# Patient Record
Sex: Male | Born: 1995 | Race: Black or African American | Hispanic: No | Marital: Single
Health system: Southern US, Community
[De-identification: ages and names within clinical notes are randomized; demographics above are authoritative.]

## PROBLEM LIST (undated history)

## (undated) DIAGNOSIS — G8254 Quadriplegia, C5-C7 incomplete: Secondary | ICD-10-CM

## (undated) DIAGNOSIS — W3400XA Accidental discharge from unspecified firearms or gun, initial encounter: Secondary | ICD-10-CM

## (undated) DIAGNOSIS — J9621 Acute and chronic respiratory failure with hypoxia: Secondary | ICD-10-CM

## (undated) DIAGNOSIS — Z930 Tracheostomy status: Secondary | ICD-10-CM

---

## 2019-01-27 ENCOUNTER — Inpatient Hospital Stay
Admission: RE | Admit: 2019-01-27 | Discharge: 2019-02-18 | Disposition: A | Discharge status: Rehabilitation Facility | Payer: Medicare Other | Source: Ambulatory Visit | Attending: Internal Medicine | Admitting: Internal Medicine

## 2019-01-27 ENCOUNTER — Other Ambulatory Visit (HOSPITAL_COMMUNITY): Payer: Medicare Other

## 2019-01-27 DIAGNOSIS — Z930 Tracheostomy status: Secondary | ICD-10-CM

## 2019-01-27 DIAGNOSIS — W3400XA Accidental discharge from unspecified firearms or gun, initial encounter: Secondary | ICD-10-CM

## 2019-01-27 DIAGNOSIS — R509 Fever, unspecified: Secondary | ICD-10-CM

## 2019-01-27 DIAGNOSIS — Z931 Gastrostomy status: Secondary | ICD-10-CM

## 2019-01-27 DIAGNOSIS — J9621 Acute and chronic respiratory failure with hypoxia: Secondary | ICD-10-CM | POA: Diagnosis present

## 2019-01-27 DIAGNOSIS — G8254 Quadriplegia, C5-C7 incomplete: Secondary | ICD-10-CM | POA: Diagnosis present

## 2019-01-27 DIAGNOSIS — J189 Pneumonia, unspecified organism: Secondary | ICD-10-CM

## 2019-01-27 HISTORY — DX: Quadriplegia, C5-C7 incomplete: G82.54

## 2019-01-27 HISTORY — DX: Acute and chronic respiratory failure with hypoxia: J96.21

## 2019-01-27 HISTORY — DX: Accidental discharge from unspecified firearms or gun, initial encounter: W34.00XA

## 2019-01-27 HISTORY — DX: Tracheostomy status: Z93.0

## 2019-01-27 MED ORDER — IOHEXOL 300 MG/ML  SOLN: 40.0000 mL | Freq: Once | INTRAMUSCULAR | Status: DC | PRN

## 2019-01-28 ENCOUNTER — Encounter: Payer: Self-pay | Admitting: Internal Medicine

## 2019-01-28 ENCOUNTER — Other Ambulatory Visit (HOSPITAL_COMMUNITY): Payer: Medicare Other

## 2019-01-28 DIAGNOSIS — Z930 Tracheostomy status: Secondary | ICD-10-CM

## 2019-01-28 DIAGNOSIS — W3400XA Accidental discharge from unspecified firearms or gun, initial encounter: Secondary | ICD-10-CM | POA: Diagnosis not present

## 2019-01-28 DIAGNOSIS — J9621 Acute and chronic respiratory failure with hypoxia: Secondary | ICD-10-CM | POA: Diagnosis not present

## 2019-01-28 DIAGNOSIS — G8254 Quadriplegia, C5-C7 incomplete: Secondary | ICD-10-CM | POA: Diagnosis not present

## 2019-01-28 LAB — COMPREHENSIVE METABOLIC PANEL
ALT: 123 U/L — ABNORMAL HIGH (ref 0–44)
AST: 72 U/L — ABNORMAL HIGH (ref 15–41)
Albumin: 2.6 g/dL — ABNORMAL LOW (ref 3.5–5.0)
Alkaline Phosphatase: 77 U/L (ref 38–126)
Anion gap: 9 (ref 5–15)
BUN: 15 mg/dL (ref 6–20)
CO2: 26 mmol/L (ref 22–32)
Calcium: 9.3 mg/dL (ref 8.9–10.3)
Chloride: 99 mmol/L (ref 98–111)
Creatinine, Ser: 0.73 mg/dL (ref 0.61–1.24)
GFR calc Af Amer: >60 mL/min (ref >60)
GFR calc non Af Amer: >60 mL/min (ref >60)
Glucose, Bld: 102 mg/dL — ABNORMAL HIGH (ref 70–99)
Potassium: 4.5 mmol/L (ref 3.5–5.1)
Sodium: 134 mmol/L — ABNORMAL LOW (ref 135–145)
Total Bilirubin: 0.6 mg/dL (ref 0.3–1.2)
Total Protein: 6.6 g/dL (ref 6.5–8.1)

## 2019-01-28 LAB — CBC
HCT: 30.4 % — ABNORMAL LOW (ref 39.0–52.0)
Hemoglobin: 9.6 g/dL — ABNORMAL LOW (ref 13.0–17.0)
MCH: 27 pg (ref 26.0–34.0)
MCHC: 31.6 g/dL (ref 30.0–36.0)
MCV: 85.4 fL (ref 80.0–100.0)
Platelets: 441 10*3/uL — ABNORMAL HIGH (ref 150–400)
RBC: 3.56 MIL/uL — ABNORMAL LOW (ref 4.22–5.81)
RDW: 13.8 % (ref 11.5–15.5)
WBC: 8.3 10*3/uL (ref 4.0–10.5)
nRBC: 0 % (ref 0.0–0.2)

## 2019-01-28 LAB — MAGNESIUM: Magnesium: 2 mg/dL (ref 1.7–2.4)

## 2019-01-28 MED ORDER — CVS NUTRITION LIQUID PO LIQD
1.00 | ORAL | Status: DC
Start: 2019-01-27 — End: 2019-01-28

## 2019-01-28 MED ORDER — NEXTERONE IV
81.00 | INTRAVENOUS | Status: DC
Start: 2019-01-28 — End: 2019-01-28

## 2019-01-28 MED ORDER — QUINERVA 260 MG PO TABS
325.00 | ORAL_TABLET | ORAL | Status: DC
Start: 2019-01-27 — End: 2019-01-28

## 2019-01-28 MED ORDER — TRIAMINIC COUGH/SORE THROAT PO
15.00 | ORAL | Status: DC
Start: 2019-01-27 — End: 2019-01-28

## 2019-01-28 MED ORDER — METHOCARBAMOL 500 MG PO TABS
500.00 | ORAL_TABLET | ORAL | Status: DC
Start: ? — End: 2019-01-28

## 2019-01-28 MED ORDER — METHYLPHENIDATE HCL POWD
50.00 | Status: DC
Start: ? — End: 2019-01-28

## 2019-01-28 MED ORDER — TUMS LASTING EFFECTS PO
30.00 | ORAL | Status: DC
Start: 2019-01-27 — End: 2019-01-28

## 2019-01-28 MED ORDER — OXYCODONE-ACETAMINOPHEN 5-325 MG PO TABS
1.00 | ORAL_TABLET | ORAL | Status: DC
Start: ? — End: 2019-01-28

## 2019-01-28 MED ORDER — Medication
30.00 | Status: DC
Start: 2019-01-27 — End: 2019-01-28

## 2019-01-28 MED ORDER — TRIAMINIC COUGH/SORE THROAT PO
15.00 | ORAL | Status: DC
Start: ? — End: 2019-01-28

## 2019-01-28 MED ORDER — Medication
Status: DC
Start: 2019-01-27 — End: 2019-01-28

## 2019-01-28 MED ORDER — GENERIC EXTERNAL MEDICATION
Status: DC
Start: ? — End: 2019-01-28

## 2019-01-28 MED ORDER — PCCA BIOPEPTIDE BASE EX CREA
600.00 | TOPICAL_CREAM | CUTANEOUS | Status: DC
Start: 2019-01-27 — End: 2019-01-28

## 2019-01-28 MED ORDER — DICLOXACILLIN SODIUM 62.5 MG/5ML PO SUSR
10.00 | ORAL | Status: DC
Start: 2019-01-27 — End: 2019-01-28

## 2019-01-28 MED ORDER — CHICKEN FLAVOR WATER MISCIBLE LIQD
100.00 | Status: DC
Start: 2019-01-27 — End: 2019-01-28

## 2019-01-28 MED ORDER — WATER BOTTLE ECONOMY #15 MISC
20.00 | Status: DC
Start: 2019-01-27 — End: 2019-01-28

## 2019-01-28 MED ORDER — MP TRI-FED COLD 2.5-60 MG PO TABS
25.00 | ORAL_TABLET | ORAL | Status: DC
Start: ? — End: 2019-01-28

## 2019-01-28 MED ORDER — ORALSEPTIC 1.4 % MT LIQD
1.00 | OROMUCOSAL | Status: DC
Start: 2019-01-27 — End: 2019-01-28

## 2019-01-28 MED ORDER — CITALOPRAM HYDROBROMIDE 10 MG/5ML PO SOLN
20.00 | ORAL | Status: DC
Start: 2019-01-28 — End: 2019-01-28

## 2019-01-28 MED ORDER — ACACIA POWD
3.00 | Status: DC
Start: 2019-01-27 — End: 2019-01-28

## 2019-01-28 MED ORDER — VICON FORTE PO CAPS
17.00 | ORAL_CAPSULE | ORAL | Status: DC
Start: 2019-01-28 — End: 2019-01-28

## 2019-01-28 MED ORDER — CALCIUM-MAGNESIUM-VITAMIN D PO
0.50 | ORAL | Status: DC
Start: ? — End: 2019-01-28

## 2019-01-28 MED ORDER — MILRINONE LACTATE 20 MG/20ML IV SOLN
0.40 | INTRAVENOUS | Status: DC
Start: ? — End: 2019-01-28

## 2019-01-28 MED ORDER — ACCU-PRO PUMP SET/VENT MISC
2.50 | Status: DC
Start: ? — End: 2019-01-28
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
## 2019-01-29 DIAGNOSIS — G8254 Quadriplegia, C5-C7 incomplete: Secondary | ICD-10-CM | POA: Diagnosis not present

## 2019-01-29 DIAGNOSIS — J9621 Acute and chronic respiratory failure with hypoxia: Secondary | ICD-10-CM | POA: Diagnosis not present

## 2019-01-29 DIAGNOSIS — W3400XA Accidental discharge from unspecified firearms or gun, initial encounter: Secondary | ICD-10-CM | POA: Diagnosis not present

## 2019-01-29 DIAGNOSIS — Z930 Tracheostomy status: Secondary | ICD-10-CM | POA: Diagnosis not present

## 2019-01-29 LAB — URINALYSIS, ROUTINE W REFLEX MICROSCOPIC
Bilirubin Urine: NEGATIVE
Glucose, UA: NEGATIVE mg/dL
Hgb urine dipstick: NEGATIVE
Ketones, ur: NEGATIVE mg/dL
Leukocytes,Ua: NEGATIVE
Nitrite: NEGATIVE
Protein, ur: NEGATIVE mg/dL
Specific Gravity, Urine: 1.025 (ref 1.005–1.030)
pH: 7 (ref 5.0–8.0)
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
## 2019-01-30 DIAGNOSIS — J9621 Acute and chronic respiratory failure with hypoxia: Secondary | ICD-10-CM | POA: Diagnosis not present

## 2019-01-30 DIAGNOSIS — Z930 Tracheostomy status: Secondary | ICD-10-CM | POA: Diagnosis not present

## 2019-01-30 DIAGNOSIS — G8254 Quadriplegia, C5-C7 incomplete: Secondary | ICD-10-CM | POA: Diagnosis not present

## 2019-01-30 DIAGNOSIS — W3400XA Accidental discharge from unspecified firearms or gun, initial encounter: Secondary | ICD-10-CM | POA: Diagnosis not present

## 2019-01-30 LAB — URINE CULTURE
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
## 2019-02-01 DIAGNOSIS — J9621 Acute and chronic respiratory failure with hypoxia: Secondary | ICD-10-CM | POA: Diagnosis not present

## 2019-02-01 DIAGNOSIS — Z930 Tracheostomy status: Secondary | ICD-10-CM | POA: Diagnosis not present

## 2019-02-01 DIAGNOSIS — G8254 Quadriplegia, C5-C7 incomplete: Secondary | ICD-10-CM | POA: Diagnosis not present

## 2019-02-01 DIAGNOSIS — W3400XA Accidental discharge from unspecified firearms or gun, initial encounter: Secondary | ICD-10-CM | POA: Diagnosis not present
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
## 2019-02-02 DIAGNOSIS — Z930 Tracheostomy status: Secondary | ICD-10-CM | POA: Diagnosis not present

## 2019-02-02 DIAGNOSIS — W3400XA Accidental discharge from unspecified firearms or gun, initial encounter: Secondary | ICD-10-CM | POA: Diagnosis not present

## 2019-02-02 DIAGNOSIS — G8254 Quadriplegia, C5-C7 incomplete: Secondary | ICD-10-CM | POA: Diagnosis not present

## 2019-02-02 DIAGNOSIS — J9621 Acute and chronic respiratory failure with hypoxia: Secondary | ICD-10-CM | POA: Diagnosis not present

## 2019-02-02 LAB — BASIC METABOLIC PANEL
Anion gap: 11 (ref 5–15)
BUN: 25 mg/dL — ABNORMAL HIGH (ref 6–20)
CO2: 25 mmol/L (ref 22–32)
Calcium: 9.4 mg/dL (ref 8.9–10.3)
Chloride: 105 mmol/L (ref 98–111)
Creatinine, Ser: 0.71 mg/dL (ref 0.61–1.24)
GFR calc Af Amer: >60 mL/min (ref >60)
GFR calc non Af Amer: >60 mL/min (ref >60)
Glucose, Bld: 123 mg/dL — ABNORMAL HIGH (ref 70–99)
Potassium: 4.3 mmol/L (ref 3.5–5.1)
Sodium: 141 mmol/L (ref 135–145)

## 2019-02-02 LAB — CBC
HCT: 30.1 % — ABNORMAL LOW (ref 39.0–52.0)
Hemoglobin: 9.4 g/dL — ABNORMAL LOW (ref 13.0–17.0)
MCH: 27.3 pg (ref 26.0–34.0)
MCHC: 31.2 g/dL (ref 30.0–36.0)
MCV: 87.5 fL (ref 80.0–100.0)
Platelets: 495 10*3/uL — ABNORMAL HIGH (ref 150–400)
RBC: 3.44 MIL/uL — ABNORMAL LOW (ref 4.22–5.81)
RDW: 14.4 % (ref 11.5–15.5)
WBC: 8.3 10*3/uL (ref 4.0–10.5)
nRBC: 0 % (ref 0.0–0.2)
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
## 2019-02-03 ENCOUNTER — Other Ambulatory Visit (HOSPITAL_COMMUNITY): Payer: Medicare Other

## 2019-02-03 DIAGNOSIS — W3400XA Accidental discharge from unspecified firearms or gun, initial encounter: Secondary | ICD-10-CM | POA: Diagnosis not present

## 2019-02-03 DIAGNOSIS — G8254 Quadriplegia, C5-C7 incomplete: Secondary | ICD-10-CM | POA: Diagnosis not present

## 2019-02-03 DIAGNOSIS — Z930 Tracheostomy status: Secondary | ICD-10-CM | POA: Diagnosis not present

## 2019-02-03 DIAGNOSIS — J9621 Acute and chronic respiratory failure with hypoxia: Secondary | ICD-10-CM | POA: Diagnosis not present

## 2019-02-03 LAB — CULTURE, BLOOD (ROUTINE X 2)
Culture: NO GROWTH
Culture: NO GROWTH
Special Requests: ADEQUATE
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
## 2019-02-04 DIAGNOSIS — J9621 Acute and chronic respiratory failure with hypoxia: Secondary | ICD-10-CM | POA: Diagnosis not present

## 2019-02-04 DIAGNOSIS — Z930 Tracheostomy status: Secondary | ICD-10-CM | POA: Diagnosis not present

## 2019-02-04 DIAGNOSIS — G8254 Quadriplegia, C5-C7 incomplete: Secondary | ICD-10-CM | POA: Diagnosis not present

## 2019-02-04 LAB — URINE CULTURE: Culture: NO GROWTH
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
## 2019-02-05 DIAGNOSIS — G8254 Quadriplegia, C5-C7 incomplete: Secondary | ICD-10-CM | POA: Diagnosis not present

## 2019-02-05 DIAGNOSIS — J9621 Acute and chronic respiratory failure with hypoxia: Secondary | ICD-10-CM | POA: Diagnosis not present

## 2019-02-05 DIAGNOSIS — Z930 Tracheostomy status: Secondary | ICD-10-CM | POA: Diagnosis not present
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
## 2019-02-06 DIAGNOSIS — Z930 Tracheostomy status: Secondary | ICD-10-CM | POA: Diagnosis not present

## 2019-02-06 DIAGNOSIS — J9621 Acute and chronic respiratory failure with hypoxia: Secondary | ICD-10-CM | POA: Diagnosis not present

## 2019-02-06 DIAGNOSIS — G8254 Quadriplegia, C5-C7 incomplete: Secondary | ICD-10-CM | POA: Diagnosis not present

## 2019-02-06 LAB — CULTURE, RESPIRATORY W GRAM STAIN
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
## 2019-02-07 DIAGNOSIS — Z930 Tracheostomy status: Secondary | ICD-10-CM | POA: Diagnosis not present

## 2019-02-07 DIAGNOSIS — J9621 Acute and chronic respiratory failure with hypoxia: Secondary | ICD-10-CM | POA: Diagnosis not present

## 2019-02-07 DIAGNOSIS — G8254 Quadriplegia, C5-C7 incomplete: Secondary | ICD-10-CM | POA: Diagnosis not present
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
## 2019-02-08 DIAGNOSIS — Z930 Tracheostomy status: Secondary | ICD-10-CM | POA: Diagnosis not present

## 2019-02-08 DIAGNOSIS — G8254 Quadriplegia, C5-C7 incomplete: Secondary | ICD-10-CM | POA: Diagnosis not present

## 2019-02-08 DIAGNOSIS — J9621 Acute and chronic respiratory failure with hypoxia: Secondary | ICD-10-CM | POA: Diagnosis not present

## 2019-02-08 LAB — CBC
HCT: 29.2 % — ABNORMAL LOW (ref 39.0–52.0)
Hemoglobin: 8.9 g/dL — ABNORMAL LOW (ref 13.0–17.0)
MCH: 27.1 pg (ref 26.0–34.0)
MCHC: 30.5 g/dL (ref 30.0–36.0)
MCV: 88.8 fL (ref 80.0–100.0)
Platelets: 489 10*3/uL — ABNORMAL HIGH (ref 150–400)
RBC: 3.29 MIL/uL — ABNORMAL LOW (ref 4.22–5.81)
RDW: 14.4 % (ref 11.5–15.5)
WBC: 5.2 10*3/uL (ref 4.0–10.5)
nRBC: 0 % (ref 0.0–0.2)

## 2019-02-08 LAB — BASIC METABOLIC PANEL
Anion gap: 11 (ref 5–15)
BUN: 23 mg/dL — ABNORMAL HIGH (ref 6–20)
CO2: 25 mmol/L (ref 22–32)
Calcium: 9.5 mg/dL (ref 8.9–10.3)
Chloride: 104 mmol/L (ref 98–111)
Creatinine, Ser: 0.77 mg/dL (ref 0.61–1.24)
GFR calc Af Amer: >60 mL/min (ref >60)
GFR calc non Af Amer: >60 mL/min (ref >60)
Glucose, Bld: 95 mg/dL (ref 70–99)
Potassium: 4.2 mmol/L (ref 3.5–5.1)
Sodium: 140 mmol/L (ref 135–145)
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
## 2019-02-09 DIAGNOSIS — G8254 Quadriplegia, C5-C7 incomplete: Secondary | ICD-10-CM | POA: Diagnosis not present

## 2019-02-09 DIAGNOSIS — Z930 Tracheostomy status: Secondary | ICD-10-CM | POA: Diagnosis not present

## 2019-02-09 DIAGNOSIS — J9621 Acute and chronic respiratory failure with hypoxia: Secondary | ICD-10-CM | POA: Diagnosis not present
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
## 2019-02-10 DIAGNOSIS — G8254 Quadriplegia, C5-C7 incomplete: Secondary | ICD-10-CM | POA: Diagnosis not present

## 2019-02-10 DIAGNOSIS — W3400XA Accidental discharge from unspecified firearms or gun, initial encounter: Secondary | ICD-10-CM | POA: Diagnosis not present

## 2019-02-10 DIAGNOSIS — J9621 Acute and chronic respiratory failure with hypoxia: Secondary | ICD-10-CM | POA: Diagnosis not present

## 2019-02-10 DIAGNOSIS — Z930 Tracheostomy status: Secondary | ICD-10-CM | POA: Diagnosis not present
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
## 2019-02-11 DIAGNOSIS — Z930 Tracheostomy status: Secondary | ICD-10-CM | POA: Diagnosis not present

## 2019-02-11 DIAGNOSIS — W3400XA Accidental discharge from unspecified firearms or gun, initial encounter: Secondary | ICD-10-CM | POA: Diagnosis not present

## 2019-02-11 DIAGNOSIS — G8254 Quadriplegia, C5-C7 incomplete: Secondary | ICD-10-CM | POA: Diagnosis not present

## 2019-02-11 DIAGNOSIS — J9621 Acute and chronic respiratory failure with hypoxia: Secondary | ICD-10-CM | POA: Diagnosis not present
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
## 2019-02-12 ENCOUNTER — Other Ambulatory Visit (HOSPITAL_COMMUNITY): Payer: Medicare Other

## 2019-02-12 DIAGNOSIS — W3400XA Accidental discharge from unspecified firearms or gun, initial encounter: Secondary | ICD-10-CM | POA: Diagnosis not present

## 2019-02-12 DIAGNOSIS — Z930 Tracheostomy status: Secondary | ICD-10-CM | POA: Diagnosis not present

## 2019-02-12 DIAGNOSIS — J9621 Acute and chronic respiratory failure with hypoxia: Secondary | ICD-10-CM | POA: Diagnosis not present

## 2019-02-12 DIAGNOSIS — G8254 Quadriplegia, C5-C7 incomplete: Secondary | ICD-10-CM | POA: Diagnosis not present
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
## 2019-02-13 DIAGNOSIS — J9621 Acute and chronic respiratory failure with hypoxia: Secondary | ICD-10-CM | POA: Diagnosis not present

## 2019-02-13 DIAGNOSIS — Z930 Tracheostomy status: Secondary | ICD-10-CM | POA: Diagnosis not present

## 2019-02-13 DIAGNOSIS — W3400XA Accidental discharge from unspecified firearms or gun, initial encounter: Secondary | ICD-10-CM | POA: Diagnosis not present

## 2019-02-13 DIAGNOSIS — G8254 Quadriplegia, C5-C7 incomplete: Secondary | ICD-10-CM | POA: Diagnosis not present
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
## 2019-02-14 DIAGNOSIS — G8254 Quadriplegia, C5-C7 incomplete: Secondary | ICD-10-CM | POA: Diagnosis not present

## 2019-02-14 DIAGNOSIS — W3400XA Accidental discharge from unspecified firearms or gun, initial encounter: Secondary | ICD-10-CM | POA: Diagnosis not present

## 2019-02-14 DIAGNOSIS — J9621 Acute and chronic respiratory failure with hypoxia: Secondary | ICD-10-CM | POA: Diagnosis not present

## 2019-02-14 DIAGNOSIS — Z930 Tracheostomy status: Secondary | ICD-10-CM | POA: Diagnosis not present
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
## 2019-02-15 DIAGNOSIS — J9621 Acute and chronic respiratory failure with hypoxia: Secondary | ICD-10-CM | POA: Diagnosis not present

## 2019-02-15 DIAGNOSIS — G8254 Quadriplegia, C5-C7 incomplete: Secondary | ICD-10-CM | POA: Diagnosis not present

## 2019-02-15 DIAGNOSIS — Z930 Tracheostomy status: Secondary | ICD-10-CM | POA: Diagnosis not present

## 2019-02-15 DIAGNOSIS — W3400XA Accidental discharge from unspecified firearms or gun, initial encounter: Secondary | ICD-10-CM | POA: Diagnosis not present
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
## 2019-02-16 ENCOUNTER — Other Ambulatory Visit (HOSPITAL_COMMUNITY): Payer: Medicare Other

## 2019-02-16 DIAGNOSIS — Z930 Tracheostomy status: Secondary | ICD-10-CM | POA: Diagnosis not present

## 2019-02-16 DIAGNOSIS — J9621 Acute and chronic respiratory failure with hypoxia: Secondary | ICD-10-CM | POA: Diagnosis not present

## 2019-02-16 DIAGNOSIS — W3400XA Accidental discharge from unspecified firearms or gun, initial encounter: Secondary | ICD-10-CM | POA: Diagnosis not present

## 2019-02-16 DIAGNOSIS — G8254 Quadriplegia, C5-C7 incomplete: Secondary | ICD-10-CM | POA: Diagnosis not present

## 2019-02-16 LAB — CBC
HCT: 37.4 % — ABNORMAL LOW (ref 39.0–52.0)
Hemoglobin: 11.8 g/dL — ABNORMAL LOW (ref 13.0–17.0)
MCH: 27.1 pg (ref 26.0–34.0)
MCHC: 31.6 g/dL (ref 30.0–36.0)
MCV: 86 fL (ref 80.0–100.0)
Platelets: 287 10*3/uL (ref 150–400)
RBC: 4.35 MIL/uL (ref 4.22–5.81)
RDW: 14.3 % (ref 11.5–15.5)
WBC: 6.2 10*3/uL (ref 4.0–10.5)
nRBC: 0 % (ref 0.0–0.2)

## 2019-02-16 LAB — BASIC METABOLIC PANEL
Anion gap: 11 (ref 5–15)
BUN: 17 mg/dL (ref 6–20)
CO2: 23 mmol/L (ref 22–32)
Calcium: 9.5 mg/dL (ref 8.9–10.3)
Chloride: 101 mmol/L (ref 98–111)
Creatinine, Ser: 0.67 mg/dL (ref 0.61–1.24)
GFR calc Af Amer: >60 mL/min (ref >60)
GFR calc non Af Amer: >60 mL/min (ref >60)
Glucose, Bld: 99 mg/dL (ref 70–99)
Potassium: 3.7 mmol/L (ref 3.5–5.1)
Sodium: 135 mmol/L (ref 135–145)
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
## 2019-02-17 DIAGNOSIS — G8254 Quadriplegia, C5-C7 incomplete: Secondary | ICD-10-CM | POA: Diagnosis not present

## 2019-02-17 DIAGNOSIS — W3400XA Accidental discharge from unspecified firearms or gun, initial encounter: Secondary | ICD-10-CM | POA: Diagnosis not present

## 2019-02-17 DIAGNOSIS — Z930 Tracheostomy status: Secondary | ICD-10-CM | POA: Diagnosis not present

## 2019-02-17 DIAGNOSIS — J9621 Acute and chronic respiratory failure with hypoxia: Secondary | ICD-10-CM | POA: Diagnosis not present

## 2019-02-17 LAB — SARS CORONAVIRUS 2 BY RT PCR (HOSPITAL ORDER, PERFORMED IN ~~LOC~~ HOSPITAL LAB): SARS Coronavirus 2: NEGATIVE
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
## 2019-03-13 ENCOUNTER — Encounter: Payer: Self-pay | Admitting: Physical Medicine and Rehabilitation

## 2019-03-24 ENCOUNTER — Encounter
Payer: Medicare Other | Attending: Physical Medicine and Rehabilitation | Admitting: Physical Medicine and Rehabilitation

## 2019-08-27 IMAGING — DX PORTABLE CHEST - 1 VIEW
1 series · 1 of 1 positions shown · non-contrast
Comparison: None.

CLINICAL DATA: 23-year-old male with prior tracheostomy.

EXAM:
PORTABLE CHEST 1 VIEW

[chest ap]
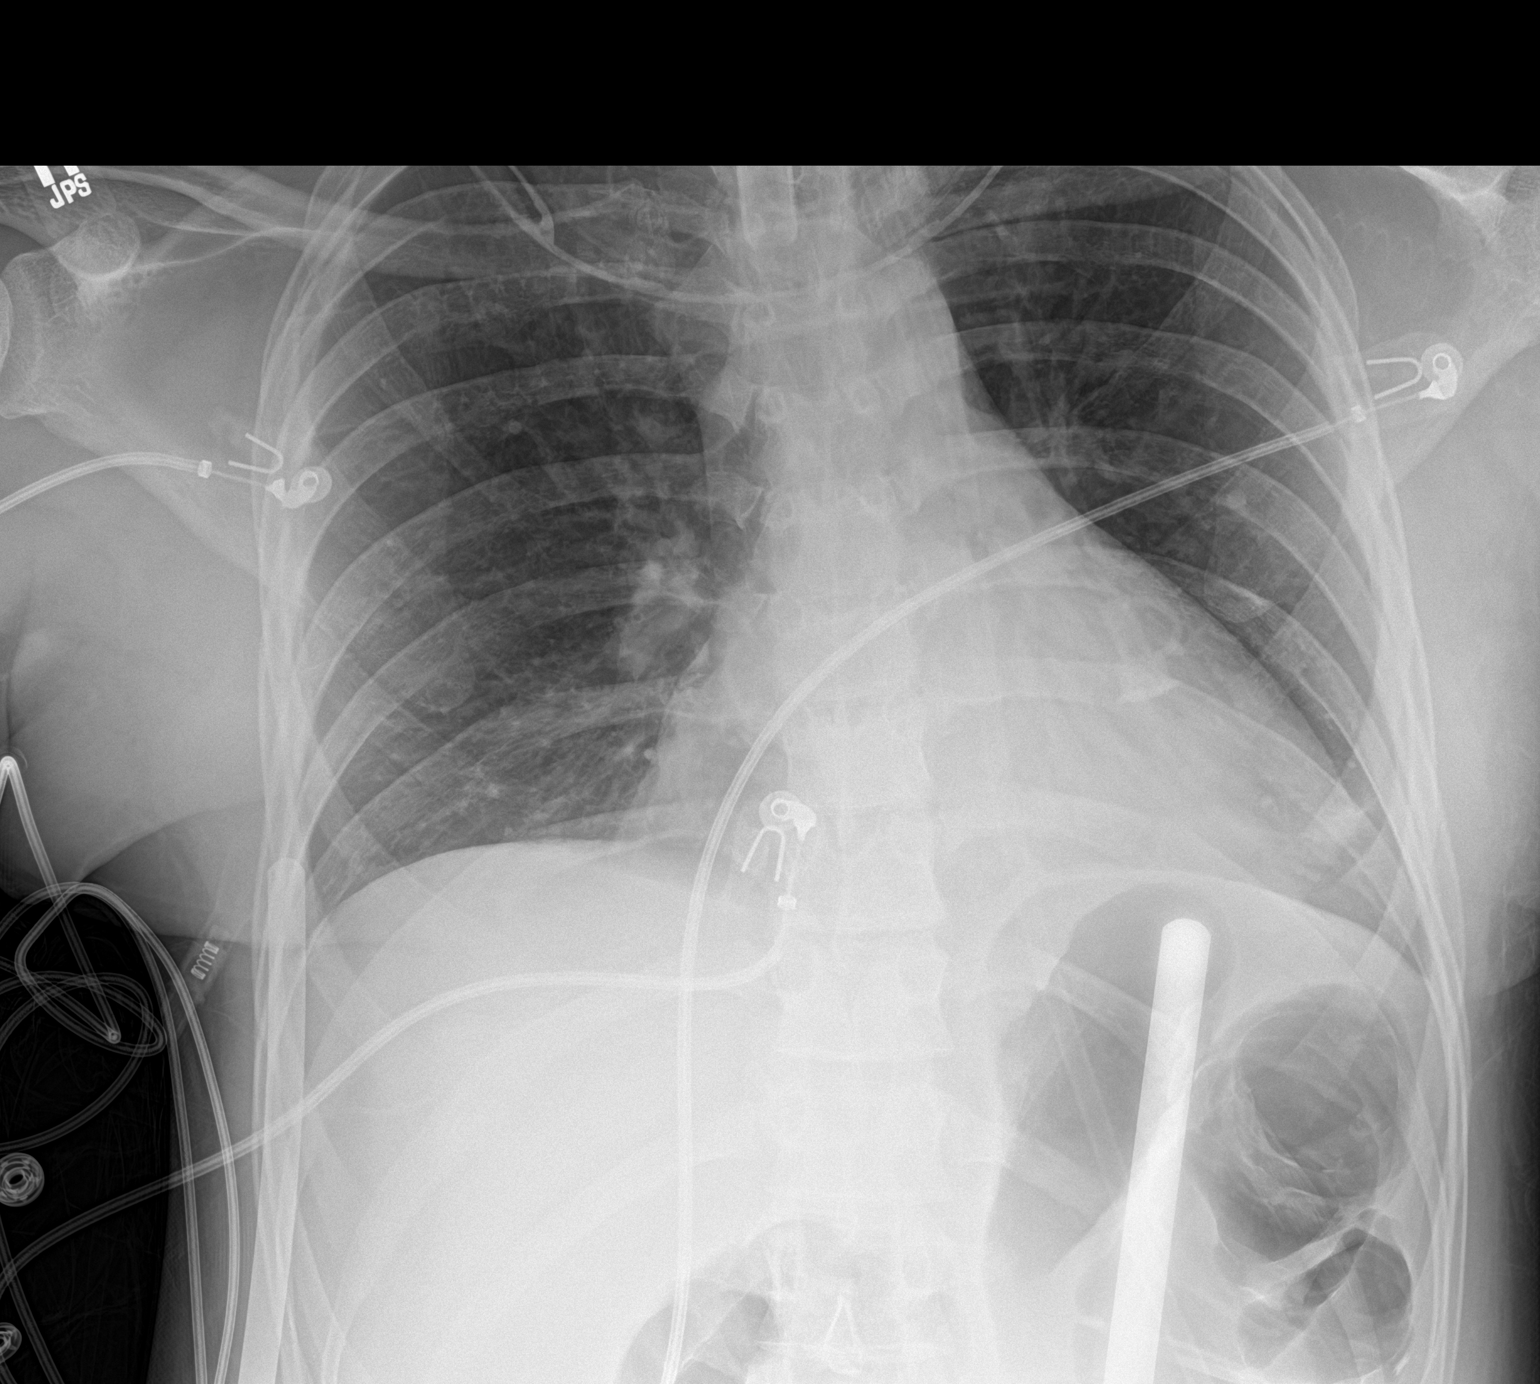

[1 of 1 positions shown; findings below may reference images not displayed]

FINDINGS: Portable AP upright view at 3232 hours. Tracheostomy projects at the
thoracic inlet in the midline with no adverse features. Normal
cardiac size and mediastinal contours. Somewhat low lung volumes.
Mild streaky bilateral infrahilar opacity most resembles
atelectasis. Elsewhere allowing for portable technique the lungs
appear clear. Spinal brace or Spineboard type artifact projects over
the upper abdomen. Negative visible bowel gas pattern.

No acute osseous abnormality identified.
IMPRESSION: Tracheostomy with no adverse features. Mild-to-moderate bilateral
lower lobe atelectasis suspected.

## 2019-09-02 IMAGING — DX PORTABLE CHEST - 1 VIEW
1 series · 1 of 1 positions shown · non-contrast
Comparison: 01/28/2019.

CLINICAL DATA: Fever.  Pneumonia.

EXAM:
PORTABLE CHEST 1 VIEW

[chest]
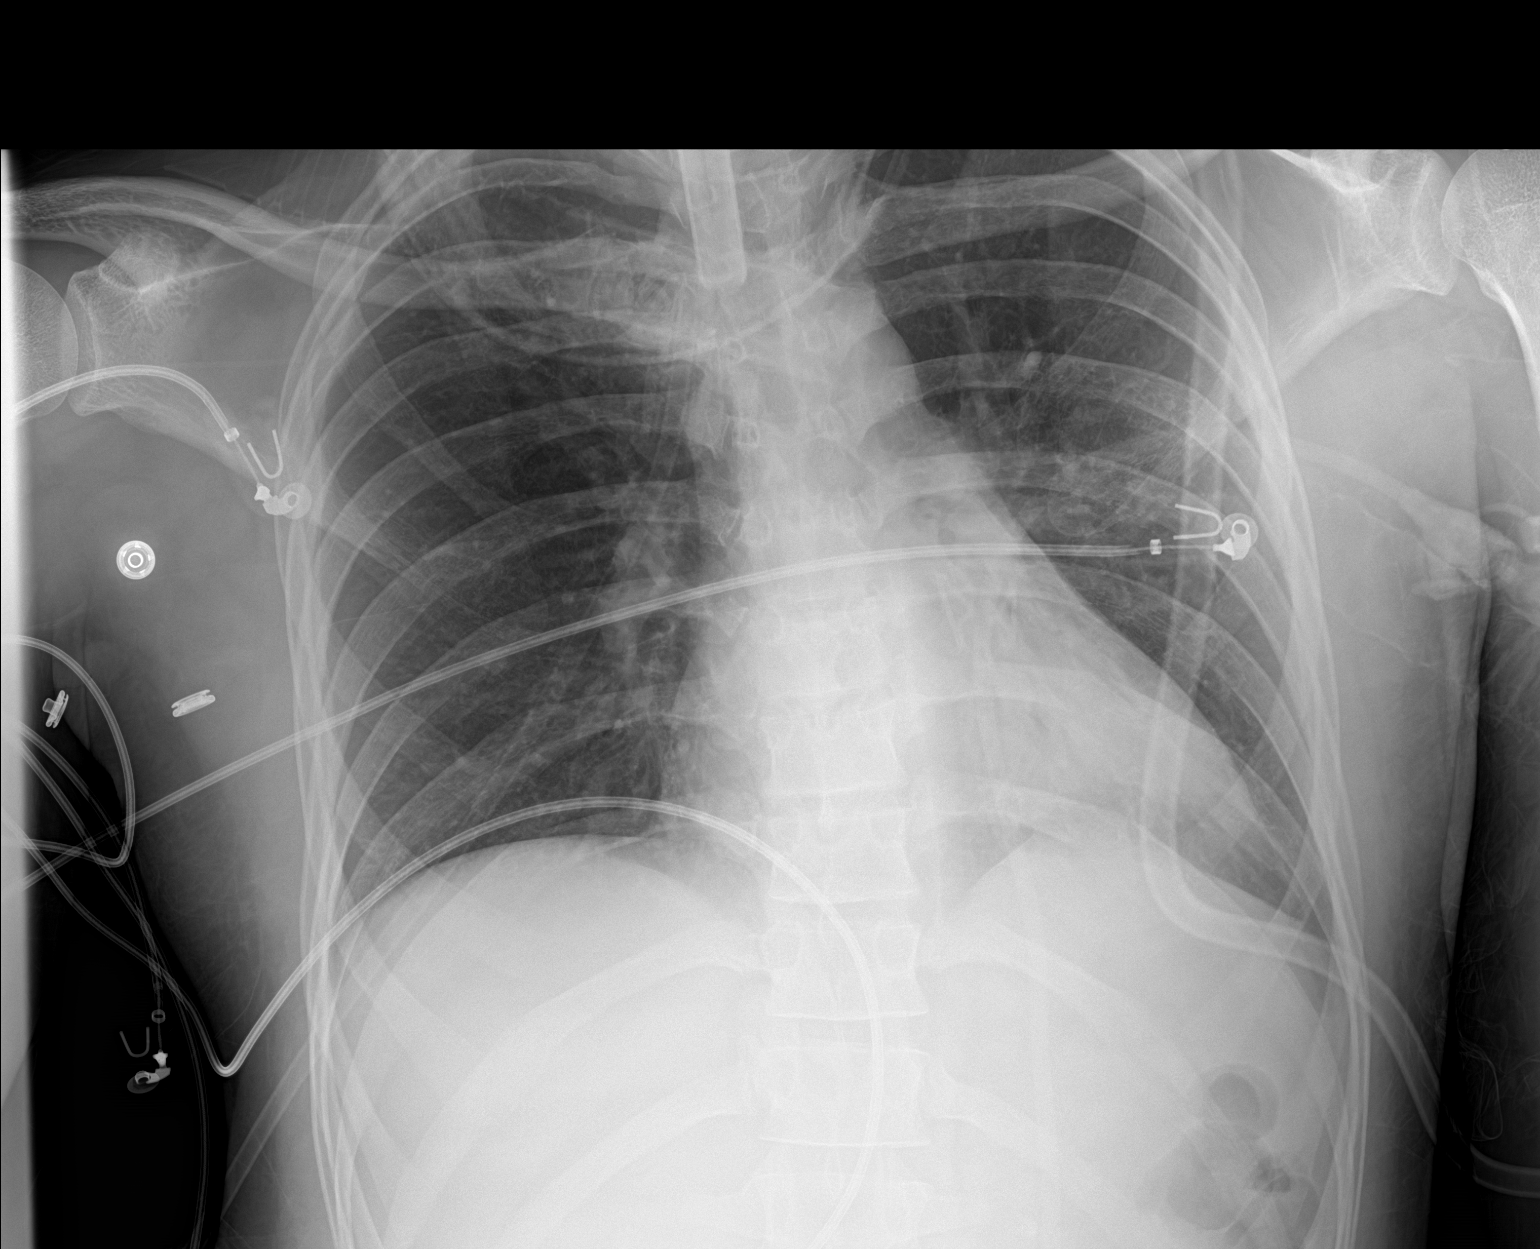

[1 of 1 positions shown; findings below may reference images not displayed]

FINDINGS: Tracheostomy tube in stable position. Left mid lung field and left
base atelectasis/infiltrates. No pleural effusion or pneumothorax.
IMPRESSION: Tracheostomy tube in stable position. Left mid lung field and left
base atelectasis/infiltrates.

## 2022-01-09 ENCOUNTER — Encounter: Payer: Self-pay | Admitting: Physical Medicine and Rehabilitation

## 2022-05-16 ENCOUNTER — Encounter
Payer: Medicare Other | Attending: Physical Medicine and Rehabilitation | Admitting: Physical Medicine and Rehabilitation

## 2022-07-24 ENCOUNTER — Encounter: Payer: Self-pay | Admitting: Physical Medicine and Rehabilitation

## 2022-09-26 ENCOUNTER — Encounter: Payer: Medicare Other | Admitting: Physical Medicine and Rehabilitation

## 2022-10-10 ENCOUNTER — Encounter
Payer: Medicare Other | Attending: Physical Medicine and Rehabilitation | Admitting: Physical Medicine and Rehabilitation
